# Patient Record
Sex: Male | Born: 1965 | Race: Black or African American | Hispanic: No | Marital: Single
Health system: Southern US, Community
[De-identification: ages and names within clinical notes are randomized; demographics above are authoritative.]

---

## 2009-11-13 ENCOUNTER — Emergency Department (HOSPITAL_BASED_OUTPATIENT_CLINIC_OR_DEPARTMENT_OTHER): Admission: EM | Admit: 2009-11-13 | Discharge: 2009-11-13 | Payer: Self-pay | Admitting: Emergency Medicine

## 2009-11-13 ENCOUNTER — Ambulatory Visit: Payer: Self-pay | Admitting: Diagnostic Radiology

## 2010-08-22 IMAGING — CR DG FOOT COMPLETE 3+V*R*
4 series · 4 of 4 positions shown · non-contrast
Comparison: The right ankle radiographs.

CLINICAL DATA: Fall.  Wall of the liver.

RIGHT FOOT COMPLETE - 3+ VIEW

[t foot lat right]
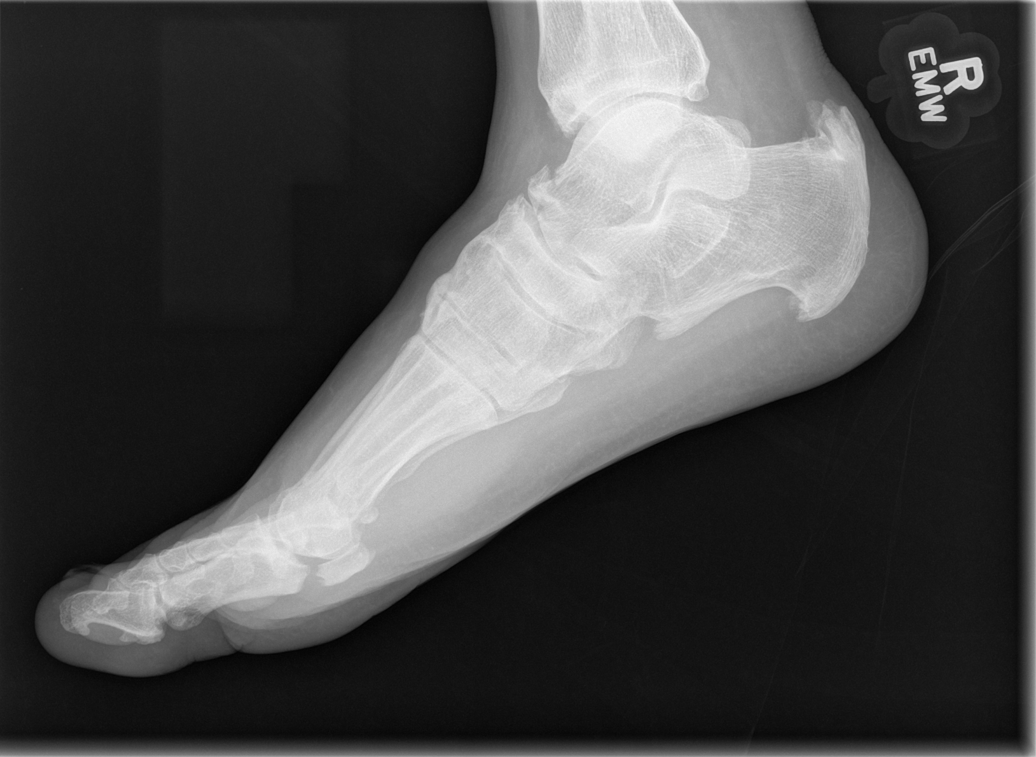

[t foot ap right]
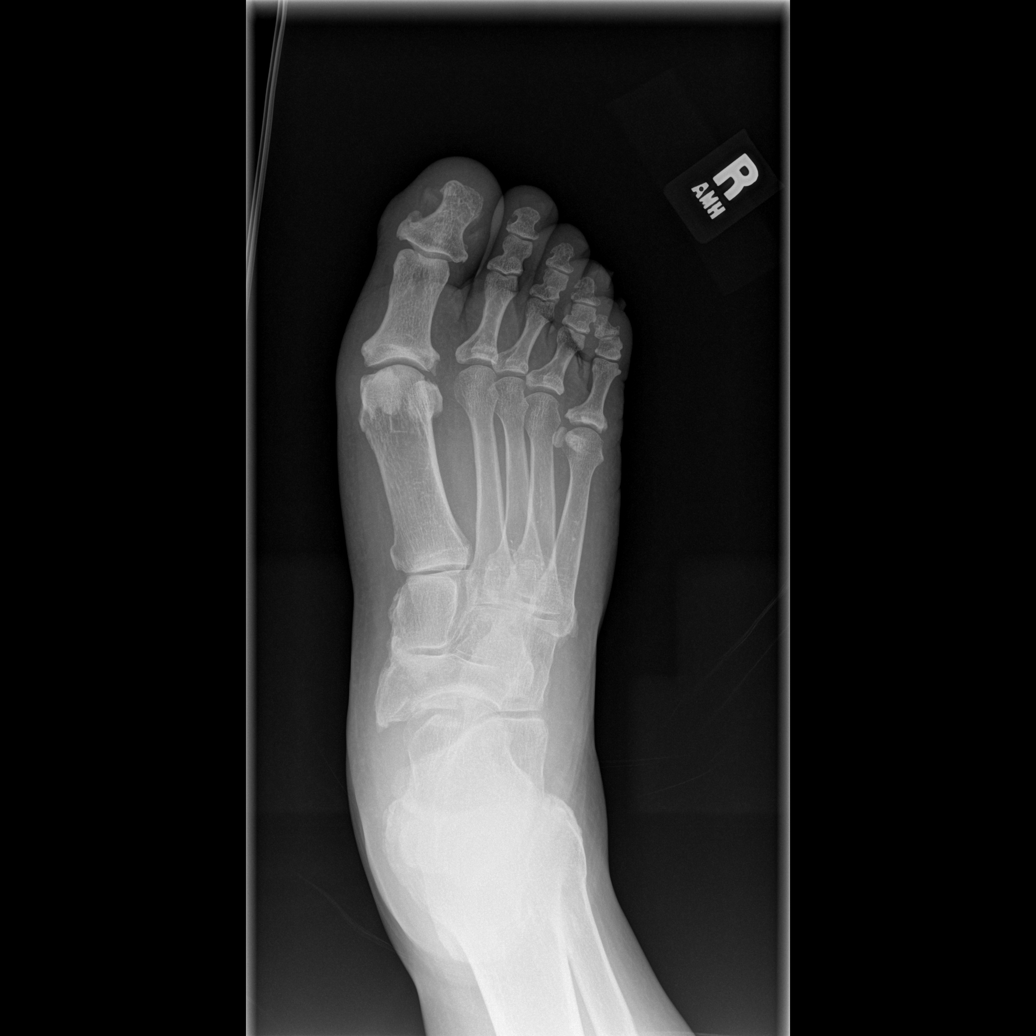

[t foot oblique right (1 of 2)]
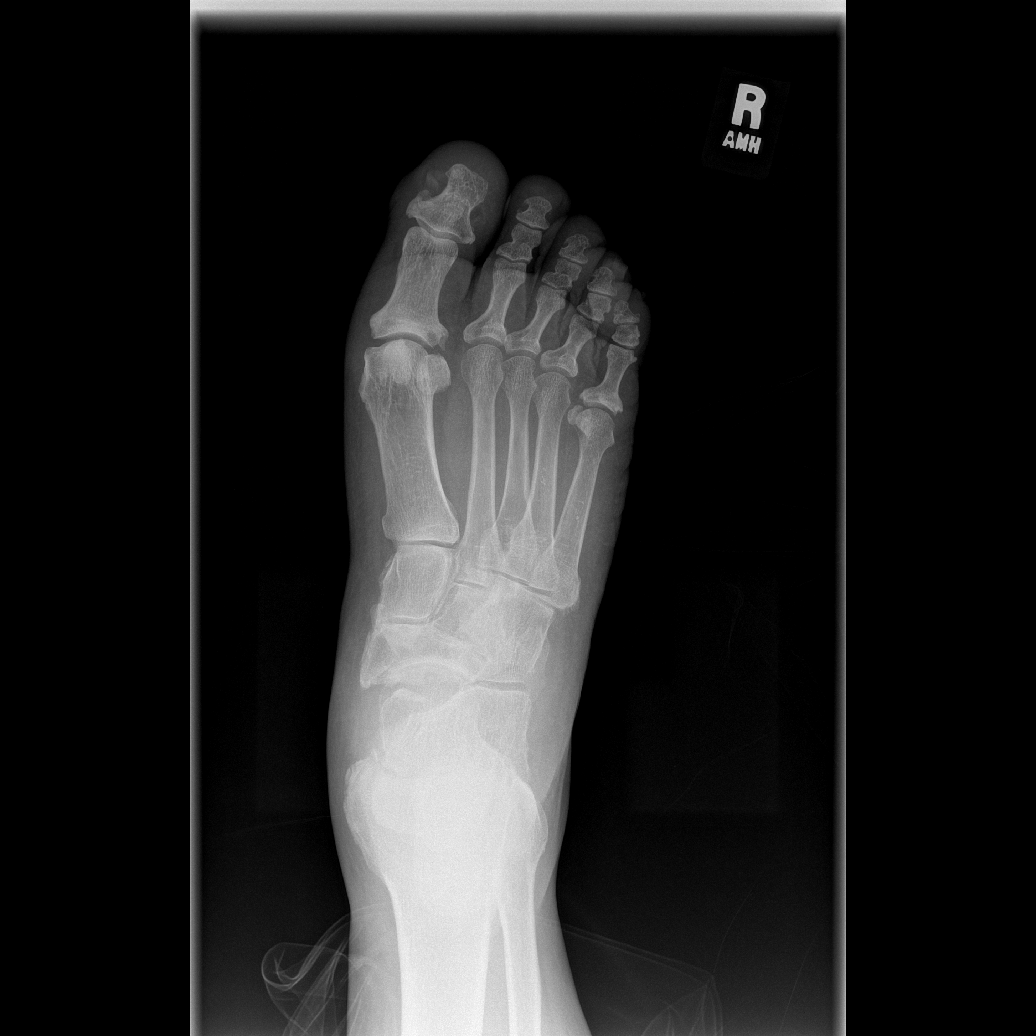

[t foot oblique right (2 of 2)]
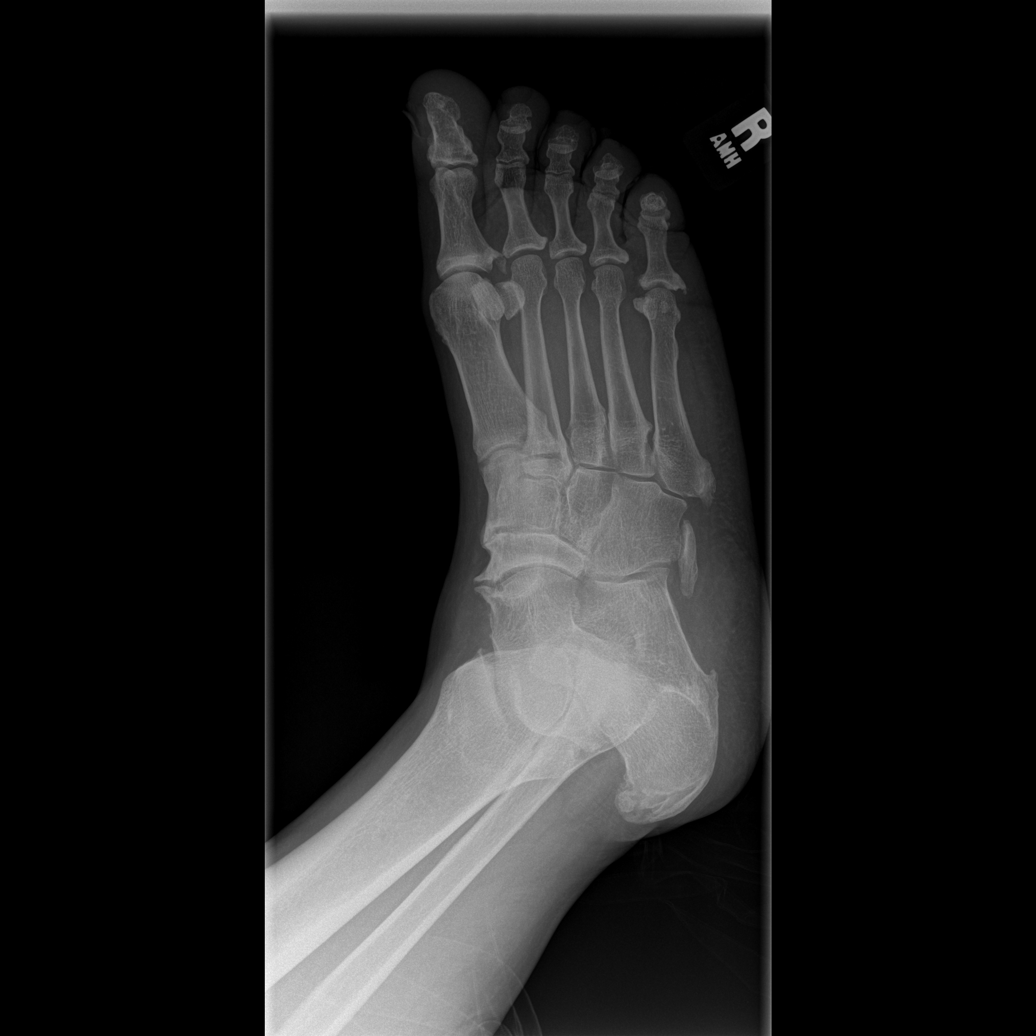

[4 of 4 positions shown; findings below may reference images not displayed]

FINDINGS: There is suggestion of a flat foot deformity, although
the patient was not standing at the time the radiograph was
performed.

There is bony proliferation associated with the midfoot, with
contiguous spur formation dorsally and along the plantar surface.
This suggests diffuse idiopathic skeletal hyperostosis.  There is a
discontinuity/lucency extending through spur formation along the
dorsal aspect of the anterior talus suspicious for an acute
fracture.  The joints of the foot are aligned.  Osteophytes are
associated with the lateral aspect of the proximal phalanx of the
first toe and along the proximal phalanx of the fifth toe.
Metatarsals and toes appear intact.
IMPRESSION: 1. Probable acutely fractured bony spur of the dorsal aspect of the
anterior talus.  Suggest correlation with point tenderness.  As
clinically indicated, CT could be used for further evaluation.
2.  Diffuse idiopathic skeletal hyperostosis of the right foot.
3.  Suspect flat foot deformity.

## 2016-02-13 ENCOUNTER — Emergency Department (HOSPITAL_COMMUNITY)
Admission: EM | Admit: 2016-02-13 | Discharge: 2016-02-13 | Discharge status: Absent without leave | Payer: Medicaid - Out of State | Attending: Emergency Medicine | Admitting: Emergency Medicine

## 2016-02-13 NOTE — ED Notes (Signed)
Per registration clerk Karin GoldenLorraine, this was entered by mistake.
